# Patient Record
Sex: Female | Born: 1990 | Race: White | Hispanic: No | Marital: Single | Smoking: Current every day smoker
Health system: Southern US, Community
[De-identification: ages and names within clinical notes are randomized; demographics above are authoritative.]

## PROBLEM LIST (undated history)

## (undated) DIAGNOSIS — E282 Polycystic ovarian syndrome: Secondary | ICD-10-CM

## (undated) DIAGNOSIS — H579 Unspecified disorder of eye and adnexa: Secondary | ICD-10-CM

## (undated) DIAGNOSIS — I10 Essential (primary) hypertension: Secondary | ICD-10-CM

---

## 2014-03-08 ENCOUNTER — Encounter (HOSPITAL_COMMUNITY): Payer: Self-pay | Admitting: Emergency Medicine

## 2014-03-08 ENCOUNTER — Emergency Department (HOSPITAL_COMMUNITY)
Admission: EM | Admit: 2014-03-08 | Discharge: 2014-03-08 | Disposition: A | Discharge status: Home / Self Care | Payer: No Typology Code available for payment source | Attending: Emergency Medicine | Admitting: Emergency Medicine

## 2014-03-08 DIAGNOSIS — I10 Essential (primary) hypertension: Secondary | ICD-10-CM | POA: Insufficient documentation

## 2014-03-08 DIAGNOSIS — Z8742 Personal history of other diseases of the female genital tract: Secondary | ICD-10-CM | POA: Insufficient documentation

## 2014-03-08 DIAGNOSIS — R1110 Vomiting, unspecified: Secondary | ICD-10-CM

## 2014-03-08 DIAGNOSIS — R112 Nausea with vomiting, unspecified: Secondary | ICD-10-CM | POA: Insufficient documentation

## 2014-03-08 DIAGNOSIS — R42 Dizziness and giddiness: Secondary | ICD-10-CM | POA: Insufficient documentation

## 2014-03-08 DIAGNOSIS — R1013 Epigastric pain: Secondary | ICD-10-CM | POA: Insufficient documentation

## 2014-03-08 DIAGNOSIS — R197 Diarrhea, unspecified: Secondary | ICD-10-CM | POA: Insufficient documentation

## 2014-03-08 DIAGNOSIS — Z91040 Latex allergy status: Secondary | ICD-10-CM | POA: Insufficient documentation

## 2014-03-08 DIAGNOSIS — Z3202 Encounter for pregnancy test, result negative: Secondary | ICD-10-CM | POA: Insufficient documentation

## 2014-03-08 HISTORY — DX: Polycystic ovarian syndrome: E28.2

## 2014-03-08 HISTORY — DX: Essential (primary) hypertension: I10

## 2014-03-08 LAB — COMPREHENSIVE METABOLIC PANEL
ALT: 16 U/L (ref 0–35)
ANION GAP: 12 (ref 5–15)
AST: 16 U/L (ref 0–37)
Albumin: 4.3 g/dL (ref 3.5–5.2)
Alkaline Phosphatase: 59 U/L (ref 39–117)
BUN: 8 mg/dL (ref 6–23)
CALCIUM: 9.4 mg/dL (ref 8.4–10.5)
CO2: 26 meq/L (ref 19–32)
CREATININE: 0.77 mg/dL (ref 0.50–1.10)
Chloride: 102 mEq/L (ref 96–112)
Glucose, Bld: 84 mg/dL (ref 70–99)
Potassium: 4.4 mEq/L (ref 3.7–5.3)
Sodium: 140 mEq/L (ref 137–147)
Total Bilirubin: 0.4 mg/dL (ref 0.3–1.2)
Total Protein: 7.7 g/dL (ref 6.0–8.3)

## 2014-03-08 LAB — CBC WITH DIFFERENTIAL/PLATELET
Basophils Absolute: 0 10*3/uL (ref 0.0–0.1)
Basophils Relative: 0 % (ref 0–1)
EOS PCT: 2 % (ref 0–5)
Eosinophils Absolute: 0.2 10*3/uL (ref 0.0–0.7)
HEMATOCRIT: 47.2 % — AB (ref 36.0–46.0)
Hemoglobin: 16.3 g/dL — ABNORMAL HIGH (ref 12.0–15.0)
LYMPHS ABS: 4.3 10*3/uL — AB (ref 0.7–4.0)
LYMPHS PCT: 38 % (ref 12–46)
MCH: 31.5 pg (ref 26.0–34.0)
MCHC: 34.5 g/dL (ref 30.0–36.0)
MCV: 91.3 fL (ref 78.0–100.0)
MONO ABS: 0.9 10*3/uL (ref 0.1–1.0)
Monocytes Relative: 8 % (ref 3–12)
Neutro Abs: 6.1 10*3/uL (ref 1.7–7.7)
Neutrophils Relative %: 52 % (ref 43–77)
Platelets: 288 10*3/uL (ref 150–400)
RBC: 5.17 MIL/uL — AB (ref 3.87–5.11)
RDW: 12.8 % (ref 11.5–15.5)
WBC: 11.5 10*3/uL — ABNORMAL HIGH (ref 4.0–10.5)

## 2014-03-08 LAB — URINALYSIS, ROUTINE W REFLEX MICROSCOPIC
Glucose, UA: NEGATIVE mg/dL
Hgb urine dipstick: NEGATIVE
KETONES UR: 15 mg/dL — AB
LEUKOCYTES UA: NEGATIVE
NITRITE: NEGATIVE
Protein, ur: NEGATIVE mg/dL
Specific Gravity, Urine: 1.028 (ref 1.005–1.030)
Urobilinogen, UA: 1 mg/dL (ref 0.0–1.0)
pH: 5.5 (ref 5.0–8.0)

## 2014-03-08 LAB — LIPASE, BLOOD: LIPASE: 20 U/L (ref 11–59)

## 2014-03-08 LAB — PREGNANCY, URINE: Preg Test, Ur: NEGATIVE

## 2014-03-08 MED ORDER — MORPHINE SULFATE 4 MG/ML IJ SOLN
4.0000 mg | Freq: Once | INTRAMUSCULAR | Status: DC
  Filled 2014-03-08: qty 1

## 2014-03-08 MED ORDER — ONDANSETRON HCL 4 MG PO TABS: 4.0000 mg | ORAL_TABLET | Freq: Four times a day (QID) | ORAL | Status: DC

## 2014-03-08 MED ORDER — SODIUM CHLORIDE 0.9 % IV SOLN
1000.0000 mL | Freq: Once | INTRAVENOUS | Status: AC
  Administered 2014-03-08: 1000 mL via INTRAVENOUS

## 2014-03-08 MED ORDER — ONDANSETRON HCL 4 MG/2ML IJ SOLN
4.0000 mg | Freq: Once | INTRAMUSCULAR | Status: AC
  Administered 2014-03-08: 4 mg via INTRAVENOUS
  Filled 2014-03-08: qty 2

## 2014-03-08 MED ORDER — SODIUM CHLORIDE 0.9 % IV SOLN: 1000.0000 mL | INTRAVENOUS | Status: DC

## 2014-03-08 NOTE — ED Notes (Signed)
Pt c/o N/V/D x 1 today with some lower back pain sharp in nature

## 2014-03-08 NOTE — ED Notes (Signed)
Patient states she is unable to provide urine specimen at this time.

## 2014-03-08 NOTE — ED Notes (Signed)
Pt stating she is feeling better after liter of fluid.  EDP made aware.  Nausea relieved.

## 2014-03-08 NOTE — ED Notes (Signed)
Pt stating that she does not morphine at the moment. "I have never had morphine.  I don't know how it's gonna make me feel".

## 2014-03-08 NOTE — ED Provider Notes (Signed)
CSN: [id number]     Arrival date & time 03/08/14  1300 History   First MD Initiated Contact with Patient 03/08/14 [phone]     Chief Complaint  Patient presents with  . Emesis  . Back Pain  . Diarrhea   HPI Pt woke up this am with nausea and vomiting.  She then started having trouble with a large amount of diarrhea.  Two episodes since of vomiting and diarrhea.  She has had some sharp pains in her back when she has to use the bathroom.  Some stomach cramps.  She feels lightheaded and dizzy.  No appetite.  Can keep down fluids.  No Menses for two years due to PCOS.  Past Medical History  Diagnosis Date  . PCOS (polycystic ovarian syndrome)   . Hypertension    History reviewed. No pertinent past surgical history. History reviewed. No pertinent family history. History  Substance Use Topics  . Smoking status: Never Smoker   . Smokeless tobacco: Not on file  . Alcohol Use: Yes     Comment: occ   OB History   Grav Para Term Preterm Abortions TAB SAB Ect Mult Living                 Review of Systems  Constitutional: Negative for fever.  Genitourinary: Negative for dysuria, vaginal bleeding and vaginal discharge.  All other systems reviewed and are negative.     Allergies  Asa; Ibuprofen; and Latex  Home Medications   Prior to Admission medications   Medication Sig Start Date End Date Taking? Authorizing Provider  ondansetron (ZOFRAN) 4 MG tablet Take 1 tablet (4 mg total) by mouth every 6 (six) hours. 03/08/14   Linwood DibblesJon Chanse Kagel, MD   BP 151/78  Pulse 73  Temp(Src) 98.3 F (36.8 C) (Oral)  Resp 23  SpO2 100% Physical Exam  Nursing note and vitals reviewed. Constitutional: She appears well-developed and well-nourished. No distress.  Obese   HENT:  Head: Normocephalic and atraumatic.  Right Ear: External ear normal.  Left Ear: External ear normal.  Eyes: Conjunctivae are normal. Right eye exhibits no discharge. Left eye exhibits no discharge. No scleral icterus.  Neck: Neck  supple. No tracheal deviation present.  Cardiovascular: Normal rate, regular rhythm and intact distal pulses.   Pulmonary/Chest: Effort normal and breath sounds normal. No stridor. No respiratory distress. She has no wheezes. She has no rales.  Abdominal: Soft. Bowel sounds are normal. She exhibits no distension. There is tenderness in the epigastric area. There is no rebound and no guarding. No hernia.  Mild ttp  Musculoskeletal: She exhibits no edema and no tenderness.  Neurological: She is alert. She has normal strength. No cranial nerve deficit (no facial droop, extraocular movements intact, no slurred speech) or sensory deficit. She exhibits normal muscle tone. She displays no seizure activity. Coordination normal.  Skin: Skin is warm and dry. No rash noted.  Psychiatric: She has a normal mood and affect.    ED Course  Procedures (including critical care time) Labs Review Labs Reviewed  CBC WITH DIFFERENTIAL - Abnormal; Notable for the following:    WBC 11.5 (*)    RBC 5.17 (*)    Hemoglobin 16.3 (*)    HCT 47.2 (*)    Lymphs Abs 4.3 (*)    All other components within normal limits  URINALYSIS, ROUTINE W REFLEX MICROSCOPIC - Abnormal; Notable for the following:    Color, Urine AMBER (*)    APPearance CLOUDY (*)  Bilirubin Urine SMALL (*)    Ketones, ur 15 (*)    All other components within normal limits  COMPREHENSIVE METABOLIC PANEL  LIPASE, BLOOD  PREGNANCY, URINE  POC URINE PREG, ED      MDM   Final diagnoses:  Vomiting and diarrhea    Symptoms improved after treatment in the ED.  She states she feels much better and is hungry and wants to get something to eat.  She was given fluids and antiemetics.  Suspect viral etiology.  Doubt appendicitis.    At this time there does not appear to be any evidence of an acute emergency medical condition and the patient appears stable for discharge with appropriate outpatient follow up.     Linwood Dibbles, MD 03/08/14 [phone]

## 2014-03-08 NOTE — Discharge Instructions (Signed)

## 2014-06-28 ENCOUNTER — Emergency Department (HOSPITAL_COMMUNITY): Payer: No Typology Code available for payment source

## 2014-06-28 ENCOUNTER — Encounter (HOSPITAL_COMMUNITY): Payer: Self-pay | Admitting: Family Medicine

## 2014-06-28 ENCOUNTER — Emergency Department (HOSPITAL_COMMUNITY)
Admission: EM | Admit: 2014-06-28 | Discharge: 2014-06-28 | Disposition: A | Discharge status: Home / Self Care | Payer: No Typology Code available for payment source | Attending: Emergency Medicine | Admitting: Emergency Medicine

## 2014-06-28 DIAGNOSIS — N939 Abnormal uterine and vaginal bleeding, unspecified: Secondary | ICD-10-CM | POA: Diagnosis not present

## 2014-06-28 DIAGNOSIS — Z3202 Encounter for pregnancy test, result negative: Secondary | ICD-10-CM | POA: Insufficient documentation

## 2014-06-28 DIAGNOSIS — I10 Essential (primary) hypertension: Secondary | ICD-10-CM | POA: Diagnosis not present

## 2014-06-28 DIAGNOSIS — R509 Fever, unspecified: Secondary | ICD-10-CM | POA: Diagnosis not present

## 2014-06-28 DIAGNOSIS — Z91040 Latex allergy status: Secondary | ICD-10-CM | POA: Diagnosis not present

## 2014-06-28 DIAGNOSIS — K529 Noninfective gastroenteritis and colitis, unspecified: Secondary | ICD-10-CM | POA: Diagnosis not present

## 2014-06-28 DIAGNOSIS — R1031 Right lower quadrant pain: Secondary | ICD-10-CM | POA: Diagnosis present

## 2014-06-28 DIAGNOSIS — E282 Polycystic ovarian syndrome: Secondary | ICD-10-CM | POA: Diagnosis not present

## 2014-06-28 LAB — URINE MICROSCOPIC-ADD ON

## 2014-06-28 LAB — CBC WITH DIFFERENTIAL/PLATELET
BASOS ABS: 0 10*3/uL (ref 0.0–0.1)
Basophils Relative: 0 % (ref 0–1)
EOS PCT: 1 % (ref 0–5)
Eosinophils Absolute: 0.2 10*3/uL (ref 0.0–0.7)
HEMATOCRIT: 44.6 % (ref 36.0–46.0)
Hemoglobin: 14.9 g/dL (ref 12.0–15.0)
LYMPHS ABS: 3.6 10*3/uL (ref 0.7–4.0)
LYMPHS PCT: 21 % (ref 12–46)
MCH: 30.2 pg (ref 26.0–34.0)
MCHC: 33.4 g/dL (ref 30.0–36.0)
MCV: 90.5 fL (ref 78.0–100.0)
MONO ABS: 1.2 10*3/uL — AB (ref 0.1–1.0)
MONOS PCT: 7 % (ref 3–12)
Neutro Abs: 12.3 10*3/uL — ABNORMAL HIGH (ref 1.7–7.7)
Neutrophils Relative %: 71 % (ref 43–77)
Platelets: 314 10*3/uL (ref 150–400)
RBC: 4.93 MIL/uL (ref 3.87–5.11)
RDW: 12.5 % (ref 11.5–15.5)
WBC: 17.3 10*3/uL — ABNORMAL HIGH (ref 4.0–10.5)

## 2014-06-28 LAB — URINALYSIS, ROUTINE W REFLEX MICROSCOPIC
Bilirubin Urine: NEGATIVE
GLUCOSE, UA: NEGATIVE mg/dL
KETONES UR: NEGATIVE mg/dL
Nitrite: NEGATIVE
PROTEIN: NEGATIVE mg/dL
Specific Gravity, Urine: 1.024 (ref 1.005–1.030)
Urobilinogen, UA: 1 mg/dL (ref 0.0–1.0)
pH: 5.5 (ref 5.0–8.0)

## 2014-06-28 LAB — WET PREP, GENITAL
Clue Cells Wet Prep HPF POC: NONE SEEN
Trich, Wet Prep: NONE SEEN
YEAST WET PREP: NONE SEEN

## 2014-06-28 LAB — COMPREHENSIVE METABOLIC PANEL
ALT: 9 U/L (ref 0–35)
AST: 10 U/L (ref 0–37)
Albumin: 3.8 g/dL (ref 3.5–5.2)
Alkaline Phosphatase: 74 U/L (ref 39–117)
Anion gap: 13 (ref 5–15)
BUN: 7 mg/dL (ref 6–23)
CO2: 28 meq/L (ref 19–32)
CREATININE: 0.72 mg/dL (ref 0.50–1.10)
Calcium: 9.4 mg/dL (ref 8.4–10.5)
Chloride: 99 mEq/L (ref 96–112)
GLUCOSE: 90 mg/dL (ref 70–99)
Potassium: 3.5 mEq/L — ABNORMAL LOW (ref 3.7–5.3)
Sodium: 140 mEq/L (ref 137–147)
Total Bilirubin: 0.4 mg/dL (ref 0.3–1.2)
Total Protein: 7.4 g/dL (ref 6.0–8.3)

## 2014-06-28 LAB — PREGNANCY, URINE: Preg Test, Ur: NEGATIVE

## 2014-06-28 MED ORDER — METRONIDAZOLE 500 MG PO TABS
500.0000 mg | ORAL_TABLET | Freq: Two times a day (BID) | ORAL | Status: AC
Start: 2014-06-28 — End: ?

## 2014-06-28 MED ORDER — HYDROCODONE-ACETAMINOPHEN 5-325 MG PO TABS
2.0000 | ORAL_TABLET | Freq: Once | ORAL | Status: AC
  Administered 2014-06-28: 2 via ORAL
  Filled 2014-06-28: qty 2

## 2014-06-28 MED ORDER — CIPROFLOXACIN HCL 500 MG PO TABS: 500.0000 mg | ORAL_TABLET | Freq: Two times a day (BID) | ORAL | Status: AC

## 2014-06-28 MED ORDER — IOHEXOL 300 MG/ML  SOLN
100.0000 mL | Freq: Once | INTRAMUSCULAR | Status: AC | PRN
  Administered 2014-06-28: 100 mL via INTRAVENOUS

## 2014-06-28 MED ORDER — HYDROCODONE-ACETAMINOPHEN 5-325 MG PO TABS: 1.0000 | ORAL_TABLET | Freq: Four times a day (QID) | ORAL | Status: AC | PRN

## 2014-06-28 MED ORDER — IOHEXOL 300 MG/ML  SOLN
25.0000 mL | Freq: Once | INTRAMUSCULAR | Status: AC | PRN
  Administered 2014-06-28: 25 mL via ORAL

## 2014-06-28 NOTE — ED Provider Notes (Signed)
CSN: [id number]     Arrival date & time 06/28/14  1525 History   First MD Initiated Contact with Patient 06/28/14 1746     Chief Complaint  Patient presents with  . Abdominal Pain     (Consider location/radiation/quality/duration/timing/severity/associated sxs/prior Treatment) Patient is a 23 y.o. female presenting with abdominal pain. The history is provided by the patient and medical records. No language interpreter was used.  Abdominal Pain Associated symptoms: vaginal bleeding and vaginal discharge   Associated symptoms: no chest pain, no constipation, no cough, no diarrhea, no dysuria, no fatigue, no fever, no hematuria, no nausea, no shortness of breath and no vomiting      Joan Livingston is a 23 y.o. female  with a hx of PCOS presents to the Emergency Department complaining of gradual, persistent, progressively worsening RLQ abd pain onset 2 days ago.  Pt reports the pain is sharp, burning, throbbing rated at a 10/10.  Pt reports the pain is similar to previous ovarian cyst pain but is much more painful.  Pt reports taking tylenol for her pain without relief.  Pt reports she began her 4 days ago and she had usual pain and cramping.  She reports she has not had a menstrual cycle in 2 years until 2 days.  Pt reports she is sexually active without use of birth control, but reports using a condom every time.  Pt without hx of STD.  Associated symptoms include white mucous vaginal discharge mixed with the blood.  Nothing makes it better and nothing makes it worse.  Pt denies fever, chills, headache, neck pain, chest pain, SOB, N/V/D, weakness, dizziness, syncope, dysuria. Patient denies history of abdominal surgeries.   Past Medical History  Diagnosis Date  . PCOS (polycystic ovarian syndrome)   . Hypertension    History reviewed. No pertinent past surgical history. History reviewed. No pertinent family history. History  Substance Use Topics  . Smoking status: Never Smoker   .  Smokeless tobacco: Not on file  . Alcohol Use: Yes     Comment: occ   OB History    No data available     Review of Systems  Constitutional: Negative for fever, diaphoresis, appetite change, fatigue and unexpected weight change.  HENT: Negative for mouth sores.   Eyes: Negative for visual disturbance.  Respiratory: Negative for cough, chest tightness, shortness of breath and wheezing.   Cardiovascular: Negative for chest pain.  Gastrointestinal: Positive for abdominal pain. Negative for nausea, vomiting, diarrhea and constipation.  Endocrine: Negative for polydipsia, polyphagia and polyuria.  Genitourinary: Positive for vaginal bleeding and vaginal discharge. Negative for dysuria, urgency, frequency and hematuria.  Musculoskeletal: Negative for back pain and neck stiffness.  Skin: Negative for rash.  Allergic/Immunologic: Negative for immunocompromised state.  Neurological: Negative for syncope, light-headedness and headaches.  Hematological: Does not bruise/bleed easily.  Psychiatric/Behavioral: Negative for sleep disturbance. The patient is not nervous/anxious.       Allergies  Asa; Ibuprofen; and Latex  Home Medications   Prior to Admission medications   Medication Sig Start Date End Date Taking? Authorizing Provider  ciprofloxacin (CIPRO) 500 MG tablet Take 1 tablet (500 mg total) by mouth every 12 (twelve) hours. 06/28/14   Shruthi Northrup, PA-C  HYDROcodone-acetaminophen (NORCO/VICODIN) 5-325 MG per tablet Take 1-2 tablets by mouth every 6 (six) hours as needed for moderate pain or severe pain. 06/28/14   Emry Tobin, PA-C  metroNIDAZOLE (FLAGYL) 500 MG tablet Take 1 tablet (500 mg total) by mouth 2 (two) times  daily. One po bid x 7 days 06/28/14   Dahlia Client Monae Topping, PA-C   BP 124/67 mmHg  Pulse 67  Temp(Src) 97.8 F (36.6 C) (Oral)  Resp 20  Ht 5\' 3"  (1.6 m)  Wt 220 lb (99.791 kg)  BMI 38.98 kg/m2  SpO2 99%  LMP 06/24/2014 Physical Exam   Constitutional: She appears well-developed and well-nourished. No distress.  HENT:  Head: Normocephalic and atraumatic.  Mouth/Throat: Oropharynx is clear and moist.  Eyes: Conjunctivae are normal. No scleral icterus.  Neck: Normal range of motion.  Cardiovascular: Normal rate, regular rhythm, normal heart sounds and intact distal pulses.   No murmur heard. Pulmonary/Chest: Effort normal and breath sounds normal. No respiratory distress. She has no wheezes.  Abdominal: Soft. Bowel sounds are normal. She exhibits no distension and no mass. There is tenderness in the right lower quadrant, suprapubic area and left lower quadrant. There is guarding. There is no rebound and no CVA tenderness. Hernia confirmed negative in the right inguinal area and confirmed negative in the left inguinal area.  No CVA tenderness Lower abd tenderness, worst in the RLQ without rebound  Genitourinary: Uterus normal. No labial fusion. There is no rash, tenderness or lesion on the right labia. There is no rash, tenderness or lesion on the left labia. Uterus is not deviated, not enlarged, not fixed and not tender. Cervix exhibits no motion tenderness, no discharge and no friability. Right adnexum displays tenderness (very mild). Right adnexum displays no mass and no fullness. Left adnexum displays no mass, no tenderness and no fullness. No erythema, tenderness or bleeding in the vagina. No foreign body around the vagina. No signs of injury around the vagina. Vaginal discharge ( thick, brown) found.  Musculoskeletal: Normal range of motion. She exhibits no edema.  Lymphadenopathy:       Right: No inguinal adenopathy present.       Left: No inguinal adenopathy present.  Neurological: She is alert.  Skin: Skin is warm and dry. She is not diaphoretic. No erythema.  Psychiatric: She has a normal mood and affect.  Nursing note and vitals reviewed.   ED Course  Procedures (including critical care time) Labs Review Labs  Reviewed  WET PREP, GENITAL - Abnormal; Notable for the following:    WBC, Wet Prep HPF POC MANY (*)    All other components within normal limits  URINALYSIS, ROUTINE W REFLEX MICROSCOPIC - Abnormal; Notable for the following:    APPearance TURBID (*)    Hgb urine dipstick LARGE (*)    Leukocytes, UA SMALL (*)    All other components within normal limits  CBC WITH DIFFERENTIAL - Abnormal; Notable for the following:    WBC 17.3 (*)    Neutro Abs 12.3 (*)    Monocytes Absolute 1.2 (*)    All other components within normal limits  COMPREHENSIVE METABOLIC PANEL - Abnormal; Notable for the following:    Potassium 3.5 (*)    All other components within normal limits  URINE MICROSCOPIC-ADD ON - Abnormal; Notable for the following:    Bacteria, UA FEW (*)    All other components within normal limits  GC/CHLAMYDIA PROBE AMP  PREGNANCY, URINE  RPR  HIV ANTIBODY (ROUTINE TESTING)    Imaging Review US Transvaginal Non-ob  06/28/2014   CLINICAL DATA:  Right lower quadrant abdominal pain for 2 days. History of polycystic ovarian disease. Evaluate for torsion. Initial encounter.  EXAM: TRANSABDOMINAL AND TRANSVAGINAL ULTRASOUND OF PELVIS  DOPPLER ULTRASOUND OF OVARIES  TECHNIQUE: Both  transabdominal and transvaginal ultrasound examinations of the pelvis were performed. Transabdominal technique was performed for global imaging of the pelvis including uterus, ovaries, adnexal regions, and pelvic cul-de-sac.  It was necessary to proceed with endovaginal exam following the transabdominal exam to visualize the endometrium. Color and duplex Doppler ultrasound was utilized to evaluate blood flow to the ovaries.  COMPARISON:  Abdominal pelvic CT same date.  FINDINGS: Uterus  Measurements: 6.8 x 2.6 x 3.4 cm. No fibroids or other mass visualized.  Endometrium  Thickness: 5.8 mm.  No focal abnormality visualized.  Right ovary  Measurements: 3.0 x 2.1 x 1.9 cm. Normal appearance/no adnexal mass. There is blood  flow with color Doppler.  Left ovary  Measurements: 4.5 x 4.4 x 4.3 cm. As noted on CT, the left ovary is enlarged with multiple mildly complex cystic areas, the largest measuring 2.5 and 3.0 cm. No solid mass identified. There is blood flow with color Doppler.  Pulsed Doppler evaluation of both ovaries demonstrates normal low-resistance arterial and venous waveforms.  Other findings  Trace free pelvic fluid is present  IMPRESSION: 1. Mildly complex left ovarian cysts associated with mild enlargement of the left ovary. 2. The right ovary appears normal. 3. No evidence of torsion.   Electronically Signed   By: Roxy Horseman M.D.   On: 06/28/2014 20:51   US Pelvis Complete  06/28/2014   CLINICAL DATA:  Right lower quadrant abdominal pain for 2 days. History of polycystic ovarian disease. Evaluate for torsion. Initial encounter.  EXAM: TRANSABDOMINAL AND TRANSVAGINAL ULTRASOUND OF PELVIS  DOPPLER ULTRASOUND OF OVARIES  TECHNIQUE: Both transabdominal and transvaginal ultrasound examinations of the pelvis were performed. Transabdominal technique was performed for global imaging of the pelvis including uterus, ovaries, adnexal regions, and pelvic cul-de-sac.  It was necessary to proceed with endovaginal exam following the transabdominal exam to visualize the endometrium. Color and duplex Doppler ultrasound was utilized to evaluate blood flow to the ovaries.  COMPARISON:  Abdominal pelvic CT same date.  FINDINGS: Uterus  Measurements: 6.8 x 2.6 x 3.4 cm. No fibroids or other mass visualized.  Endometrium  Thickness: 5.8 mm.  No focal abnormality visualized.  Right ovary  Measurements: 3.0 x 2.1 x 1.9 cm. Normal appearance/no adnexal mass. There is blood flow with color Doppler.  Left ovary  Measurements: 4.5 x 4.4 x 4.3 cm. As noted on CT, the left ovary is enlarged with multiple mildly complex cystic areas, the largest measuring 2.5 and 3.0 cm. No solid mass identified. There is blood flow with color Doppler.  Pulsed  Doppler evaluation of both ovaries demonstrates normal low-resistance arterial and venous waveforms.  Other findings  Trace free pelvic fluid is present  IMPRESSION: 1. Mildly complex left ovarian cysts associated with mild enlargement of the left ovary. 2. The right ovary appears normal. 3. No evidence of torsion.   Electronically Signed   By: Roxy Horseman M.D.   On: 06/28/2014 20:51   Ct Abdomen Pelvis W Contrast  06/28/2014   CLINICAL DATA:  Gradual persistent progressively worsening RIGHT lower quadrant abdominal pain beginning 2 days ago, pain now sharp burning and throbbing rated 10/10 ; vaginal bleeding and discharge; history hypertension  EXAM: CT ABDOMEN AND PELVIS WITH CONTRAST  TECHNIQUE: Multidetector CT imaging of the abdomen and pelvis was performed using the standard protocol following bolus administration of intravenous contrast. Sagittal and coronal MPR images reconstructed from axial data set.  CONTRAST:  OMNIPAQUE IOHEXOL 300 MG/ML SOLN IV. Dilute oral contrast.  COMPARISON:  None  FINDINGS: Lung bases clear.  Liver, spleen, pancreas, kidneys, and adrenal glands normal.  Splenule at splenic hilum.  Normal appendix.  Bladder, ureters, uterus and RIGHT ovary.  LEFT ovary enlarged measuring 6.3 x 5.9 x 4.4 cm in size, containing multiple cystic areas, question complex cystic mass, multiple cysts, less likely tubo-ovarian abscess.  Subtle bowel wall thickening and hyperemia of terminal ileum extending to ileocecal valve.  Scattered distal colonic diverticulosis.  Stomach and remaining bowel loops normal appearance.  No adenopathy, free air, free fluid, or acute osseous findings.  IMPRESSION: Normal appendix.  Subtle bowel wall thickening and hyperemia of terminal ileum extended ileocecal valve suggestive of small bowel enteritis, question infection versus inflammatory bowel disease.  Multi-cystic mass of an enlarged LEFT ovary 6.3 x 5.9 x 4.4 cm in size question cystic neoplasm, multiple  cysts, or tubo-ovarian abscess; sonographic evaluation recommended.   Electronically Signed   By: Ulyses Southward M.D.   On: 06/28/2014 20:23   [place] Art/ven Flow Abd Pelv Doppler  06/28/2014   CLINICAL DATA:  Right lower quadrant abdominal pain for 2 days. History of polycystic ovarian disease. Evaluate for torsion. Initial encounter.  EXAM: TRANSABDOMINAL AND TRANSVAGINAL ULTRASOUND OF PELVIS  DOPPLER ULTRASOUND OF OVARIES  TECHNIQUE: Both transabdominal and transvaginal ultrasound examinations of the pelvis were performed. Transabdominal technique was performed for global imaging of the pelvis including uterus, ovaries, adnexal regions, and pelvic cul-de-sac.  It was necessary to proceed with endovaginal exam following the transabdominal exam to visualize the endometrium. Color and duplex Doppler ultrasound was utilized to evaluate blood flow to the ovaries.  COMPARISON:  Abdominal pelvic CT same date.  FINDINGS: Uterus  Measurements: 6.8 x 2.6 x 3.4 cm. No fibroids or other mass visualized.  Endometrium  Thickness: 5.8 mm.  No focal abnormality visualized.  Right ovary  Measurements: 3.0 x 2.1 x 1.9 cm. Normal appearance/no adnexal mass. There is blood flow with color Doppler.  Left ovary  Measurements: 4.5 x 4.4 x 4.3 cm. As noted on CT, the left ovary is enlarged with multiple mildly complex cystic areas, the largest measuring 2.5 and 3.0 cm. No solid mass identified. There is blood flow with color Doppler.  Pulsed Doppler evaluation of both ovaries demonstrates normal low-resistance arterial and venous waveforms.  Other findings  Trace free pelvic fluid is present  IMPRESSION: 1. Mildly complex left ovarian cysts associated with mild enlargement of the left ovary. 2. The right ovary appears normal. 3. No evidence of torsion.   Electronically Signed   By: Roxy Horseman M.D.   On: 06/28/2014 20:51     EKG Interpretation None      MDM   Final diagnoses:  RLQ abdominal pain  PCOS (polycystic ovarian  syndrome)  Vaginal bleeding  Enteritis   Joan Livingston presents with RLQ abd pain, and mild vaginal discharge several days after the onset of her menses. Patient with a history of PCO S and believes her pain is related to a cyst. Patient denies fevers or chills, nausea or vomiting. She did have diarrhea the onset of her menses but this is common for her.  Pelvic exam with very mild right adnexal tenderness, no cervical motion tenderness and no left adnexal tenderness or masses. Patient with right lower quadrant abdominal pain with guarding on exam and very mild left lower quadrant abdominal pain on exam. Will obtain CT scan and ultrasound.  Wet prep with many white blood cells however no clinical evidence of PID as  patient does not have cervical motion tenderness. CT scan with subtle bowel wall thickening and hyperemia of the terminal ileum suggestive small bowel enteritis.  Unclear on the CT whether this is infection versus inflammatory bowel disease. Left ovary with an large cystic mass characterized on the ultrasound as complex left ovarian cysts with normal Doppler.  Pelvic ultrasound without evidence of left mass being a tubo-ovarian abscess.  Patient's pain controlled here in the emergency department and she is tolerating by mouth without difficulty. No emesis. Patient is alert, oriented, nontoxic and nonseptic appearing. She is febrile and non-tachycardic. Repeat abdominal exam shows persistent mild right lower quadrant abdominal tenderness but abdomen is soft without rigidity or guarding. No peritoneal signs.  I have personally reviewed patient's vitals, nursing note and any pertinent labs or imaging.  I performed an focused physical exam; undressed when appropriate .    It has been determined that no acute conditions requiring further emergency intervention are present at this time. The patient/guardian have been advised of the diagnosis and plan. Gonorrhea and Chlamydia cultures are  pending and patient will need treatment if they return as positive.  I reviewed any labs and imaging including any potential incidental findings. We have discussed signs and symptoms that warrant return to the ED including fever, worsening pain or vomiting.    Vital signs are stable at discharge.   BP 124/67 mmHg  Pulse 67  Temp(Src) 97.8 F (36.6 C) (Oral)  Resp 20  Ht 5\' 3"  (1.6 m)  Wt 220 lb (99.791 kg)  BMI 38.98 kg/m2  SpO2 99%  LMP 06/24/2014        Dierdre ForthHannah Aliah Eriksson, PA-C 06/28/14 2217  Benny LennertJoseph L Zammit, MD 06/28/14 2226

## 2014-06-28 NOTE — ED Notes (Signed)
Pelvic cart ready @ bedside, pt fully undressed waist down.

## 2014-06-28 NOTE — ED Notes (Signed)
Pt at CT

## 2014-06-28 NOTE — Discharge Instructions (Signed)
1. Medications: cipro, flagyl, vicodin, usual home medications 2. Treatment: rest, drink plenty of fluids,  3. Follow Up: Please followup with your primary doctor and/or gastroenterologist in 3 days for discussion of your diagnoses and further evaluation after today's visit; if you do not have a primary care doctor use the resource guide provided to find one; Please return to the ER for fevers, worsening abdominal pain, vomiting or other concerning symptoms   Abdominal Pain Many things can cause abdominal pain. Usually, abdominal pain is not caused by a disease and will improve without treatment. It can often be observed and treated at home. Your health care provider will do a physical exam and possibly order blood tests and X-rays to help determine the seriousness of your pain. However, in many cases, more time must pass before a clear cause of the pain can be found. Before that point, your health care provider may not know if you need more testing or further treatment. HOME CARE INSTRUCTIONS  Monitor your abdominal pain for any changes. The following actions may help to alleviate any discomfort you are experiencing:  Only take over-the-counter or prescription medicines as directed by your health care provider.  Do not take laxatives unless directed to do so by your health care provider.  Try a clear liquid diet (broth, tea, or water) as directed by your health care provider. Slowly move to a bland diet as tolerated. SEEK MEDICAL CARE IF:  You have unexplained abdominal pain.  You have abdominal pain associated with nausea or diarrhea.  You have pain when you urinate or have a bowel movement.  You experience abdominal pain that wakes you in the night.  You have abdominal pain that is worsened or improved by eating food.  You have abdominal pain that is worsened with eating fatty foods.  You have a fever. SEEK IMMEDIATE MEDICAL CARE IF:   Your pain does not go away within 2  hours.  You keep throwing up (vomiting).  Your pain is felt only in portions of the abdomen, such as the right side or the left lower portion of the abdomen.  You pass bloody or black tarry stools. MAKE SURE YOU:  Understand these instructions.   Will watch your condition.   Will get help right away if you are not doing well or get worse.  Document Released: 05/05/2005 Document Revised: 07/31/2013 Document Reviewed: 04/04/2013 [facility] Patient Information 2015 [employer] [place], [place]. This information is not intended to replace advice given to you by your health care provider. Make sure you discuss any questions you have with your health care provider.

## 2014-06-28 NOTE — ED Notes (Signed)
Per pt sts lower abdominal pain and vaginal discharge. sts more on the right side. sts hx of polycystic ovarian syndrome.

## 2014-06-29 LAB — HIV ANTIBODY (ROUTINE TESTING W REFLEX): HIV: NONREACTIVE

## 2014-06-29 LAB — GC/CHLAMYDIA PROBE AMP
CT Probe RNA: POSITIVE — AB
GC Probe RNA: POSITIVE — AB

## 2014-06-29 LAB — RPR

## 2014-06-30 ENCOUNTER — Telehealth: Payer: Self-pay | Admitting: Emergency Medicine

## 2014-06-30 NOTE — Telephone Encounter (Signed)
+  Chlamydia. +Gonorrhea. Chart sent to EDP office for review. DHHA attached.

## 2014-07-01 ENCOUNTER — Telehealth (HOSPITAL_BASED_OUTPATIENT_CLINIC_OR_DEPARTMENT_OTHER): Payer: Self-pay | Admitting: *Deleted

## 2014-08-04 ENCOUNTER — Telehealth (HOSPITAL_COMMUNITY): Payer: Self-pay

## 2014-08-04 NOTE — ED Notes (Signed)
Unable to contact pt by mail or telephone. Unable to communicate lab results or treatment changes. 

## 2014-08-12 ENCOUNTER — Emergency Department: Payer: Self-pay | Admitting: Emergency Medicine

## 2014-08-12 LAB — CBC WITH DIFFERENTIAL/PLATELET
Basophil #: 0.1 x10 3/mm 3
Basophil %: 0.7 %
Eosinophil #: 0.3 x10 3/mm 3
Eosinophil %: 1.4 %
HCT: 47.1 % — ABNORMAL HIGH
HGB: 15.5 g/dL
Lymphocyte %: 26.9 %
Lymphs Abs: 5 x10 3/mm 3 — ABNORMAL HIGH
MCH: 30.7 pg
MCHC: 32.9 g/dL
MCV: 93 fL
Monocyte #: 1.1 "x10 3/mm " — ABNORMAL HIGH
Monocyte %: 5.8 %
Neutrophil #: 12 x10 3/mm 3 — ABNORMAL HIGH
Neutrophil %: 65.2 %
Platelet: 346 x10 3/mm 3
RBC: 5.04 X10 6/mm 3
RDW: 13.1 %
WBC: 18.4 x10 3/mm 3 — ABNORMAL HIGH

## 2014-08-12 LAB — URINALYSIS, COMPLETE
Bilirubin,UR: NEGATIVE
GLUCOSE, UR: NEGATIVE mg/dL (ref 0–75)
Ketone: NEGATIVE
Nitrite: NEGATIVE
Ph: 6 (ref 4.5–8.0)
Protein: 100
Specific Gravity: 1.02 (ref 1.003–1.030)
Squamous Epithelial: 7
WBC UR: 102 /HPF (ref 0–5)

## 2014-08-12 LAB — COMPREHENSIVE METABOLIC PANEL
ALBUMIN: 3.9 g/dL (ref 3.4–5.0)
ANION GAP: 8 (ref 7–16)
Alkaline Phosphatase: 79 U/L
BUN: 9 mg/dL (ref 7–18)
Bilirubin,Total: 0.3 mg/dL (ref 0.2–1.0)
CHLORIDE: 103 mmol/L (ref 98–107)
CO2: 29 mmol/L (ref 21–32)
CREATININE: 0.85 mg/dL (ref 0.60–1.30)
Calcium, Total: 9.1 mg/dL (ref 8.5–10.1)
EGFR (African American): >60
EGFR (Non-African Amer.): >60
Glucose: 102 mg/dL — ABNORMAL HIGH (ref 65–99)
Osmolality: 278 (ref 275–301)
Potassium: 3.7 mmol/L (ref 3.5–5.1)
SGOT(AST): 22 U/L (ref 15–37)
SGPT (ALT): 21 U/L
Sodium: 140 mmol/L (ref 136–145)
TOTAL PROTEIN: 7.9 g/dL (ref 6.4–8.2)

## 2014-12-08 NOTE — Consult Note (Signed)
PATIENT NAME:  Joan Livingston, Graclynn J MR#:  [id number] DATE OF BIRTH:  1990-09-15  DATE OF CONSULTATION:  08/13/2014  CONSULTING PHYSICIAN:  Adah Salvageichard E. Excell Seltzerooper, MD  SURGICAL CONSULTATION    CHIEF COMPLAINT: Right lower quadrant pain.   HISTORY OF PRESENT ILLNESS: This is a patient with a 2 day history of abdominal pain, points to the right lower quadrant and suprapubic area stating that it has been going on 2 days. She also states and volunteers that she had the exact same pain 4 to 6 weeks ago, was seen at [facility] and was told that she had an infection in her bowel and was treated with antibiotics. She denies any diarrhea. No nausea or vomiting. No fevers or chills. She had some dysuria earlier today but none since and does not notice any blood in her urine. She has no back pain.   PAST MEDICAL HISTORY: None.   PAST SURGICAL HISTORY: Tonsillectomy, adenoidectomy.   ALLERGIES: ASPIRIN AND IBUPROFEN.   MEDICATIONS: None.   FAMILY HISTORY: Noncontributory.   SOCIAL HISTORY: The patient smokes tobacco.   REVIEW OF SYSTEMS: Ten systems review was performed and negative with the exception of that in the HPI.   PHYSICAL EXAMINATION:  GENERAL: Obese female patient with a BMI of 39. She appears comfortable, in no acute distress.  VITAL SIGNS: Temperature is 96.6, pulse 91, respirations 18, blood pressure 156/77.  HEENT: No scleral icterus.  NECK: No palpable neck nodes.  CHEST: Clear to auscultation.  CARDIAC: Regular rate and rhythm.  ABDOMEN: Soft. There is tenderness in the right lower quadrant without guarding or rebound, also tenderness in the suprapubic area without guarding or rebound. Negative Rovsing sign.  EXTREMITIES: Without edema. The calves are nontender.  NEUROLOGIC: Grossly intact.  INTEGUMENT: No jaundice. There is some component of hirsutism.   LABORATORY VALUES: Demonstrate an elevated white blood cell count of 18,000, an H and H  of 15 and 47, and a platelet count of 346.  Electrolytes are within normal limits. Urinary tract evaluation demonstrates a specific gravity of 1.020, 3+ blood, 1+ leukocyte esterase, 470 RBCs per high-power field with white blood cells approximating 100 per high-power field and trace bacteria.   CT scan is personally reviewed. Appendix appears quite normal. Tip of the appendix slightly enlarged.  More importantly I believe the right salpinx is enlarged and there is a huge left ovarian cyst.   ASSESSMENT AND PLAN: Right lower quadrant pain with leukocytosis, hematuria with considerable microscopic nature, 500 cells per high-powered field, as well as leukocyte esterase positivity. I discussed with Dr. Manson PasseyBrown this patient's possible diagnoses. I doubt that appendicitis explains all that is going on with her and it does not really show up on CT scan either. She has no signs of peritoneal irritation at this time. I asked Dr. Manson PasseyBrown to consider a pelvic ultrasound, which he agreed to do and will call me back if needed.   ____________________________ Adah Salvageichard E. Excell Seltzerooper, MD rec:AT D: 08/13/2014 03:15:51 ET T: 08/13/2014 04:19:45 ET JOB#: [id number]  cc: Adah Salvageichard E. Excell Seltzerooper, MD, <Dictator> Lattie HawICHARD E COOPER MD ELECTRONICALLY SIGNED 08/13/2014 19:43

## 2014-12-08 NOTE — Consult Note (Signed)
Brief Consult Note: Diagnosis: RLQ pain.   Patient was seen by consultant.   Consult note dictated.   Recommend further assessment or treatment.   Discussed with Attending MD.   Comments: nml appendix on CT with leukocytosis, 500 RBC/HPF in urine. Also on CT is large L ov cyst but rt salpinx appears enlarged. discussed with Dr Manson PasseyBrown who will order pelvic U/S.  Electronic Signatures: Lattie Hawooper, Richard E (MD)  (Signed 05-Jan-16 03:07)  Authored: Brief Consult Note   Last Updated: 05-Jan-16 03:07 by Lattie Hawooper, Richard E (MD)

## 2015-02-16 ENCOUNTER — Emergency Department (HOSPITAL_BASED_OUTPATIENT_CLINIC_OR_DEPARTMENT_OTHER)
Admission: EM | Admit: 2015-02-16 | Discharge: 2015-02-16 | Disposition: A | Discharge status: Home / Self Care | Payer: No Typology Code available for payment source | Attending: Emergency Medicine | Admitting: Emergency Medicine

## 2015-02-16 ENCOUNTER — Encounter (HOSPITAL_BASED_OUTPATIENT_CLINIC_OR_DEPARTMENT_OTHER): Payer: Self-pay | Admitting: Emergency Medicine

## 2015-02-16 ENCOUNTER — Emergency Department (HOSPITAL_BASED_OUTPATIENT_CLINIC_OR_DEPARTMENT_OTHER): Payer: No Typology Code available for payment source

## 2015-02-16 DIAGNOSIS — Z8639 Personal history of other endocrine, nutritional and metabolic disease: Secondary | ICD-10-CM | POA: Insufficient documentation

## 2015-02-16 DIAGNOSIS — I10 Essential (primary) hypertension: Secondary | ICD-10-CM | POA: Insufficient documentation

## 2015-02-16 DIAGNOSIS — Z3202 Encounter for pregnancy test, result negative: Secondary | ICD-10-CM | POA: Insufficient documentation

## 2015-02-16 DIAGNOSIS — Z91040 Latex allergy status: Secondary | ICD-10-CM | POA: Insufficient documentation

## 2015-02-16 DIAGNOSIS — R1011 Right upper quadrant pain: Secondary | ICD-10-CM | POA: Insufficient documentation

## 2015-02-16 LAB — URINALYSIS, ROUTINE W REFLEX MICROSCOPIC
Glucose, UA: NEGATIVE mg/dL
Hgb urine dipstick: NEGATIVE
Ketones, ur: 15 mg/dL — AB
NITRITE: NEGATIVE
PH: 5.5 (ref 5.0–8.0)
Protein, ur: NEGATIVE mg/dL
SPECIFIC GRAVITY, URINE: 1.042 — AB (ref 1.005–1.030)
Urobilinogen, UA: 1 mg/dL (ref 0.0–1.0)

## 2015-02-16 LAB — URINE MICROSCOPIC-ADD ON

## 2015-02-16 LAB — CBC WITH DIFFERENTIAL/PLATELET
BASOS ABS: 0 10*3/uL (ref 0.0–0.1)
BASOS PCT: 0 % (ref 0–1)
EOS ABS: 0.1 10*3/uL (ref 0.0–0.7)
Eosinophils Relative: 1 % (ref 0–5)
HCT: 43.1 % (ref 36.0–46.0)
Hemoglobin: 14.8 g/dL (ref 12.0–15.0)
Lymphocytes Relative: 29 % (ref 12–46)
Lymphs Abs: 3.3 10*3/uL (ref 0.7–4.0)
MCH: 30.3 pg (ref 26.0–34.0)
MCHC: 34.3 g/dL (ref 30.0–36.0)
MCV: 88.3 fL (ref 78.0–100.0)
MONOS PCT: 9 % (ref 3–12)
Monocytes Absolute: 1 10*3/uL (ref 0.1–1.0)
NEUTROS PCT: 61 % (ref 43–77)
Neutro Abs: 6.9 10*3/uL (ref 1.7–7.7)
PLATELETS: 249 10*3/uL (ref 150–400)
RBC: 4.88 MIL/uL (ref 3.87–5.11)
RDW: 12.3 % (ref 11.5–15.5)
WBC: 11.4 10*3/uL — AB (ref 4.0–10.5)

## 2015-02-16 LAB — COMPREHENSIVE METABOLIC PANEL
ALBUMIN: 4.3 g/dL (ref 3.5–5.0)
ALT: 19 U/L (ref 14–54)
ANION GAP: 9 (ref 5–15)
AST: 18 U/L (ref 15–41)
Alkaline Phosphatase: 45 U/L (ref 38–126)
BILIRUBIN TOTAL: 1 mg/dL (ref 0.3–1.2)
BUN: 14 mg/dL (ref 6–20)
CALCIUM: 9 mg/dL (ref 8.9–10.3)
CHLORIDE: 104 mmol/L (ref 101–111)
CO2: 26 mmol/L (ref 22–32)
CREATININE: 0.78 mg/dL (ref 0.44–1.00)
GFR calc Af Amer: >60 mL/min (ref >60)
Glucose, Bld: 95 mg/dL (ref 65–99)
Potassium: 3.4 mmol/L — ABNORMAL LOW (ref 3.5–5.1)
Sodium: 139 mmol/L (ref 135–145)
Total Protein: 7.8 g/dL (ref 6.5–8.1)

## 2015-02-16 LAB — LIPASE, BLOOD: Lipase: 19 U/L — ABNORMAL LOW (ref 22–51)

## 2015-02-16 LAB — PREGNANCY, URINE: PREG TEST UR: NEGATIVE

## 2015-02-16 MED ORDER — ACETAMINOPHEN 325 MG PO TABS
650.0000 mg | ORAL_TABLET | Freq: Once | ORAL | Status: AC
  Administered 2015-02-16: 650 mg via ORAL
  Filled 2015-02-16: qty 2

## 2015-02-16 MED ORDER — GI COCKTAIL ~~LOC~~
30.0000 mL | Freq: Once | ORAL | Status: AC
  Administered 2015-02-16: 30 mL via ORAL
  Filled 2015-02-16: qty 30

## 2015-02-16 NOTE — ED Notes (Signed)
Pt asks if it is ok for her to eat. Pt informed she could eat as tolerated. Pt states "good, I'm stopping for food on the way home."

## 2015-02-16 NOTE — Discharge Instructions (Signed)
Abdominal Pain, Women °Abdominal (stomach, pelvic, or belly) pain can be caused by many things. It is important to tell your doctor: °· The location of the pain. °· Does it come and go or is it present all the time? °· Are there things that start the pain (eating certain foods, exercise)? °· Are there other symptoms associated with the pain (fever, nausea, vomiting, diarrhea)? °All of this is helpful to know when trying to find the cause of the pain. °CAUSES  °· Stomach: virus or bacteria infection, or ulcer. °· Intestine: appendicitis (inflamed appendix), regional ileitis (Crohn's disease), ulcerative colitis (inflamed colon), irritable bowel syndrome, diverticulitis (inflamed diverticulum of the colon), or cancer of the stomach or intestine. °· Gallbladder disease or stones in the gallbladder. °· Kidney disease, kidney stones, or infection. °· Pancreas infection or cancer. °· Fibromyalgia (pain disorder). °· Diseases of the female organs: °¨ Uterus: fibroid (non-cancerous) tumors or infection. °¨ Fallopian tubes: infection or tubal pregnancy. °¨ Ovary: cysts or tumors. °¨ Pelvic adhesions (scar tissue). °¨ Endometriosis (uterus lining tissue growing in the pelvis and on the pelvic organs). °¨ Pelvic congestion syndrome (female organs filling up with blood just before the menstrual period). °¨ Pain with the menstrual period. °¨ Pain with ovulation (producing an egg). °¨ Pain with an IUD (intrauterine device, birth control) in the uterus. °¨ Cancer of the female organs. °· Functional pain (pain not caused by a disease, may improve without treatment). °· Psychological pain. °· Depression. °DIAGNOSIS  °Your doctor will decide the seriousness of your pain by doing an examination. °· Blood tests. °· X-rays. °· Ultrasound. °· CT scan (computed tomography, special type of X-Irena Gaydos). °· MRI (magnetic resonance imaging). °· Cultures, for infection. °· Barium enema (dye inserted in the large intestine, to better view it with  X-rays). °· Colonoscopy (looking in intestine with a lighted tube). °· Laparoscopy (minor surgery, looking in abdomen with a lighted tube). °· Major abdominal exploratory surgery (looking in abdomen with a large incision). °TREATMENT  °The treatment will depend on the cause of the pain.  °· Many cases can be observed and treated at home. °· Over-the-counter medicines recommended by your caregiver. °· Prescription medicine. °· Antibiotics, for infection. °· Birth control pills, for painful periods or for ovulation pain. °· Hormone treatment, for endometriosis. °· Nerve blocking injections. °· Physical therapy. °· Antidepressants. °· Counseling with a psychologist or psychiatrist. °· Minor or major surgery. °HOME CARE INSTRUCTIONS  °· Do not take laxatives, unless directed by your caregiver. °· Take over-the-counter pain medicine only if ordered by your caregiver. Do not take aspirin because it can cause an upset stomach or bleeding. °· Try a clear liquid diet (broth or water) as ordered by your caregiver. Slowly move to a bland diet, as tolerated, if the pain is related to the stomach or intestine. °· Have a thermometer and take your temperature several times a day, and record it. °· Bed rest and sleep, if it helps the pain. °· Avoid sexual intercourse, if it causes pain. °· Avoid stressful situations. °· Keep your follow-up appointments and tests, as your caregiver orders. °· If the pain does not go away with medicine or surgery, you may try: °¨ Acupuncture. °¨ Relaxation exercises (yoga, meditation). °¨ Group therapy. °¨ Counseling. °SEEK MEDICAL CARE IF:  °· You notice certain foods cause stomach pain. °· Your home care treatment is not helping your pain. °· You need stronger pain medicine. °· You want your IUD removed. °· You feel faint or   lightheaded. °· You develop nausea and vomiting. °· You develop a rash. °· You are having side effects or an allergy to your medicine. °SEEK IMMEDIATE MEDICAL CARE IF:  °· Your  pain does not go away or gets worse. °· You have a fever. °· Your pain is felt only in portions of the abdomen. The right side could possibly be appendicitis. The left lower portion of the abdomen could be colitis or diverticulitis. °· You are passing blood in your stools (bright red or black tarry stools, with or without vomiting). °· You have blood in your urine. °· You develop chills, with or without a fever. °· You pass out. °MAKE SURE YOU:  °· Understand these instructions. °· Will watch your condition. °· Will get help right away if you are not doing well or get worse. °Document Released: 05/23/2007 Document Revised: 12/10/2013 Document Reviewed: 06/12/2009 °[employer]® Patient Information ©2015 [employer]. This information is not intended to replace advice given to you by your health care provider. Make sure you discuss any questions you have with your health care provider. ° °

## 2015-02-16 NOTE — ED Notes (Addendum)
Pt in c/o R flank pain that radiates to lateral ribs, worse on inspiration. Denies dysuria, frequency, or hx of renal stones.

## 2015-02-16 NOTE — ED Provider Notes (Signed)
CSN: [id number]     Arrival date & time 02/16/15  2125 History  This chart was scribed for Joan Grizzle, Joan Livingston by Abel Presto, ED Scribe. This patient was seen in room MH04/MH04 and the patient's care was started at 10:17 PM.    Chief Complaint  Patient presents with  . Flank Pain     The history is provided by the patient. No language interpreter was used.   HPI Comments: Joan Livingston is a 24 y.o. female with PMHx of HTN and PCOS who presents to the Emergency Department complaining of aching right sided flank pain radiating to lateral ribs with onset yesterday evening. Pt notes movement and dee inspiration aggravates the pain. She reports SOB secondary to pain. She states pain increased today after eating. Pt is an occasional smoker. Pt denies vomiting, nausea, changes in appetite, vaginal bleeding, dysuria and hematuria.   Past Medical History  Diagnosis Date  . PCOS (polycystic ovarian syndrome)   . Hypertension    History reviewed. No pertinent past surgical history. History reviewed. No pertinent family history. History  Substance Use Topics  . Smoking status: Never Smoker   . Smokeless tobacco: Not on file  . Alcohol Use: Yes     Comment: occ   OB History    No data available     Review of Systems  Constitutional: Negative for appetite change.  Gastrointestinal: Positive for abdominal pain. Negative for nausea and vomiting.  Genitourinary: Positive for flank pain. Negative for dysuria, hematuria and vaginal bleeding.  All other systems reviewed and are negative.     Allergies  Asa; Ibuprofen; and Latex  Home Medications   Prior to Admission medications   Medication Sig Start Date End Date Taking? Authorizing Provider  ciprofloxacin (CIPRO) 500 MG tablet Take 1 tablet (500 mg total) by mouth every 12 (twelve) hours. 06/28/14   Hannah Muthersbaugh, PA-C  HYDROcodone-acetaminophen (NORCO/VICODIN) 5-325 MG per tablet Take 1-2 tablets by mouth every 6  (six) hours as needed for moderate pain or severe pain. 06/28/14   Hannah Muthersbaugh, PA-C  metroNIDAZOLE (FLAGYL) 500 MG tablet Take 1 tablet (500 mg total) by mouth 2 (two) times daily. One po bid x 7 days 06/28/14   Dahlia Client Muthersbaugh, PA-C   BP 147/87 mmHg  Pulse 79  Temp(Src) 98.4 F (36.9 C) (Oral)  Resp 20  Ht  (1.6 m)  Wt 225 lb (102.059 kg)  BMI 39.87 kg/m2  SpO2 100%  LMP 07/18/2014 Physical Exam  Constitutional: She is oriented to person, place, and time. She appears well-developed and well-nourished.  HENT:  Head: Normocephalic.  Eyes: Conjunctivae are normal.  Neck: Normal range of motion. Neck supple.  Pulmonary/Chest: Effort normal.  Musculoskeletal: Normal range of motion.  Neurological: She is alert and oriented to person, place, and time.  Skin: Skin is warm and dry.  Psychiatric: She has a normal mood and affect. Her behavior is normal.  Nursing note and vitals reviewed.   ED Course  Procedures (including critical care time) DIAGNOSTIC STUDIES: Oxygen Saturation is 100% on room air, normal by my interpretation.    COORDINATION OF CARE: 10:21 PM Discussed treatment plan with patient at beside, the patient agrees with the plan and has no further questions at this time.   Labs Review Labs Reviewed  URINALYSIS, ROUTINE W REFLEX MICROSCOPIC (NOT AT [facility]) - Abnormal; Notable for the following:    Color, Urine AMBER (*)    Specific Gravity, Urine 1.042 (*)    Bilirubin  Urine SMALL (*)    Ketones, ur 15 (*)    Leukocytes, UA SMALL (*)    All other components within normal limits  URINE MICROSCOPIC-ADD ON - Abnormal; Notable for the following:    Squamous Epithelial / LPF FEW (*)    Bacteria, UA FEW (*)    All other components within normal limits  PREGNANCY, URINE    Imaging Review No results found.   EKG Interpretation None      MDM   Final diagnoses:  RUQ pain  24 y.o. Female with ruq pain- pain increased after vomiting.  No  cholecystitis seen on us.  Patient stable here .   I personally performed the services described in this documentation, which was scribed in my presence. The recorded information has been reviewed and considered.   Joan Grizzleanielle Rindy Kollman, Joan Livingston 02/16/15 [phone]

## 2016-07-15 IMAGING — CT CT ABD-PELV W/ CM
2 of 4 series · 16 of 46 positions shown, 18 images · IV contrast (omnipaque)
Comparison: None.

CLINICAL DATA: Right lower quadrant pain for 2 days. Seen for the
same 1 month ago when was diagnosed with intestinal infection and
put on antibiotics. Symptoms feel the same. Painful urination.

EXAM:
CT ABDOMEN AND PELVIS WITH CONTRAST
TECHNIQUE: Multidetector CT imaging of the abdomen and pelvis was performed
using the standard protocol following bolus administration of
intravenous contrast.
CONTRAST:  100 mL Omnipaque 300

[Series 2: routine abd pel with · axial · 0.85mm/px · z∈[-488,-23]mm · 13 of 103 slices shown, 15 images]
[im 5/103  soft-tissue]
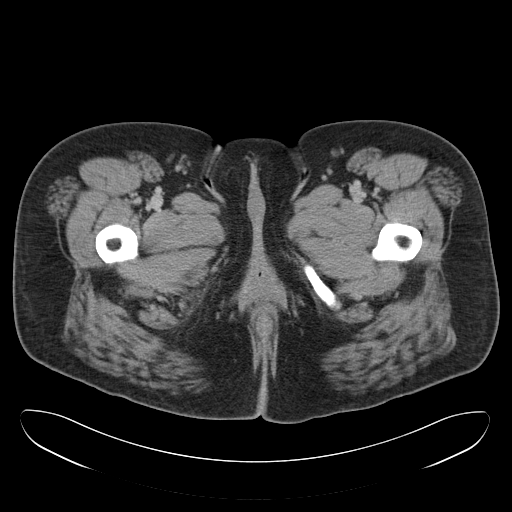
[im 5/103  bone]
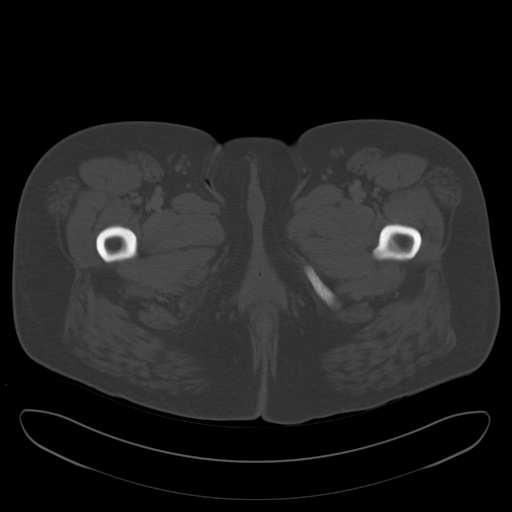
[im 14/103  soft-tissue]
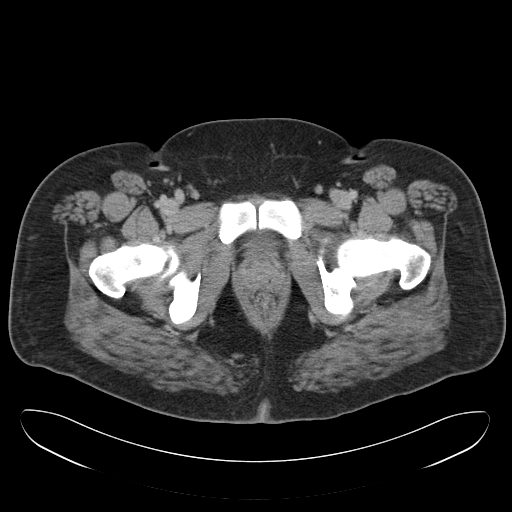
[im 23/103  soft-tissue]
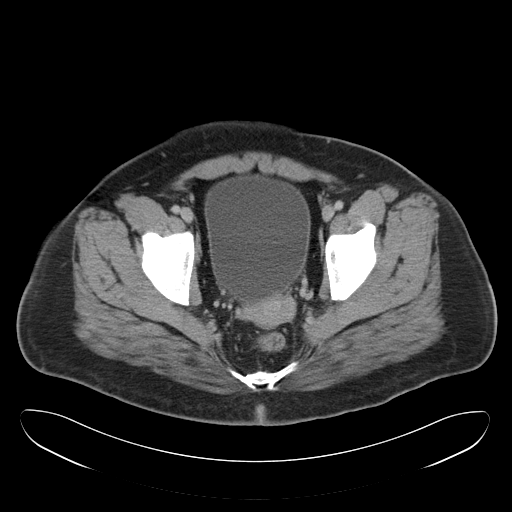
[im 27/103  soft-tissue]
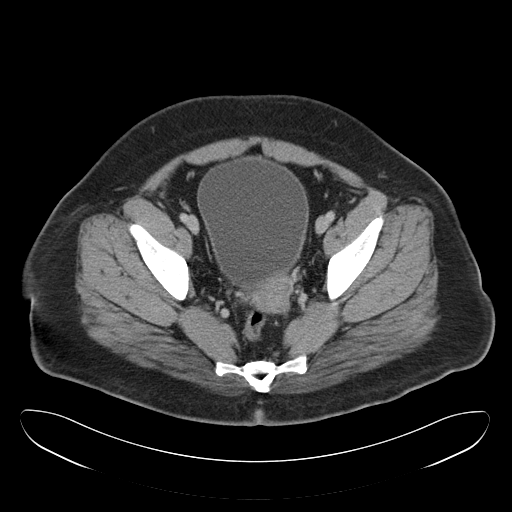
[im 36/103  soft-tissue]
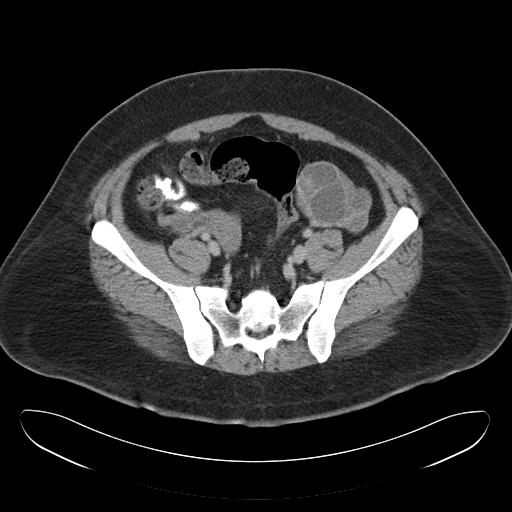
[im 45/103  soft-tissue]
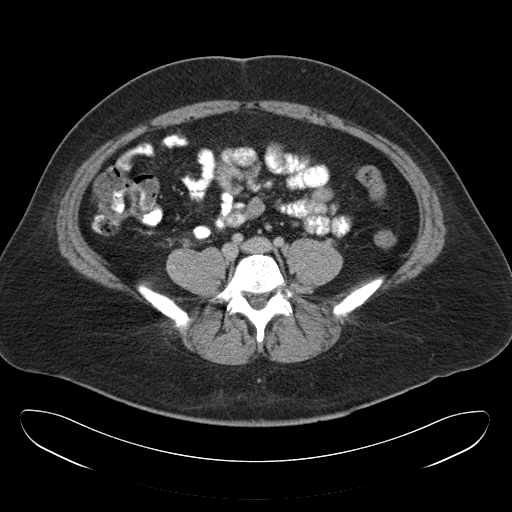
[im 54/103  soft-tissue]
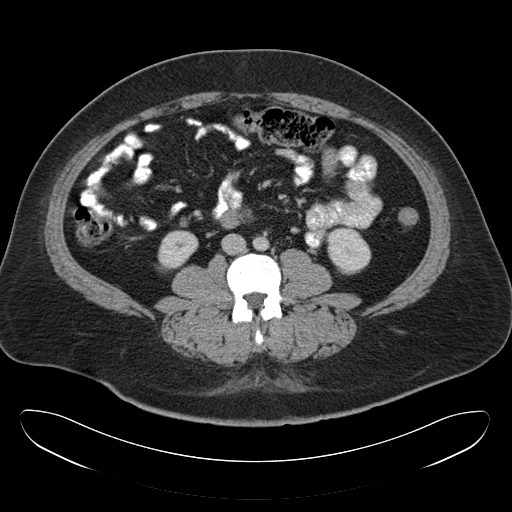
[im 58/103  soft-tissue]
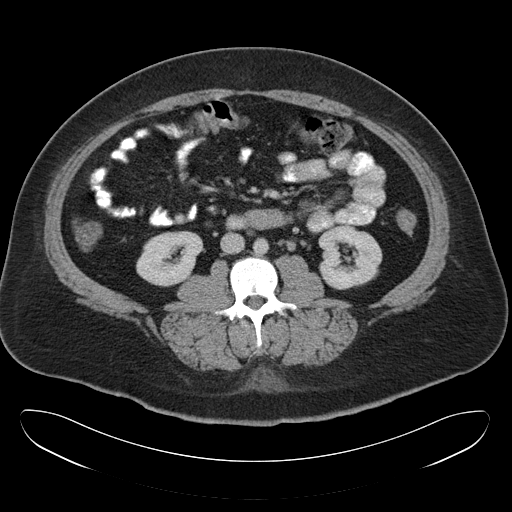
[im 67/103  soft-tissue]
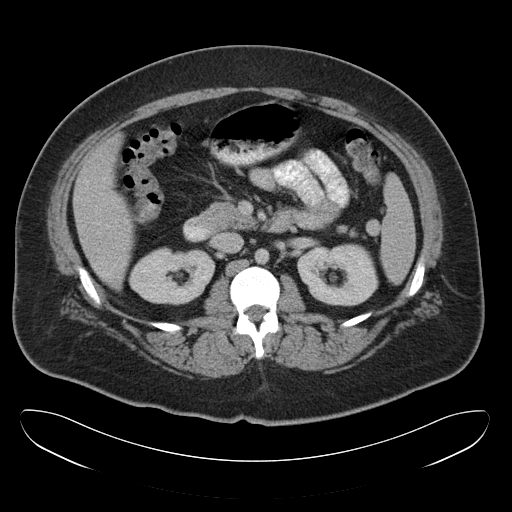
[im 67/103  bone]
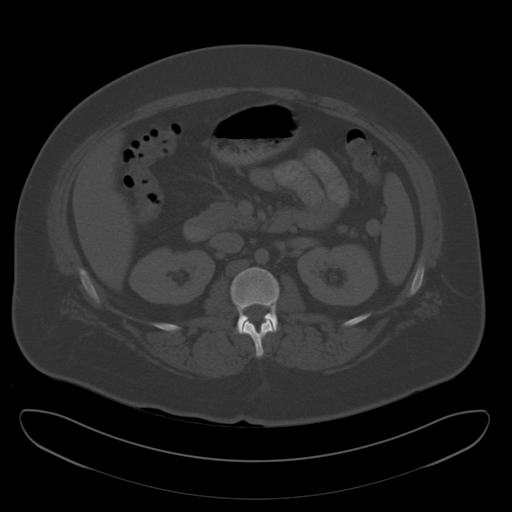
[im 76/103  soft-tissue]
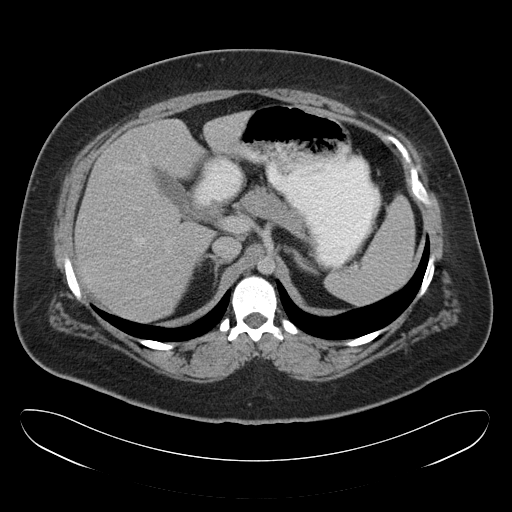
[im 80/103  soft-tissue]
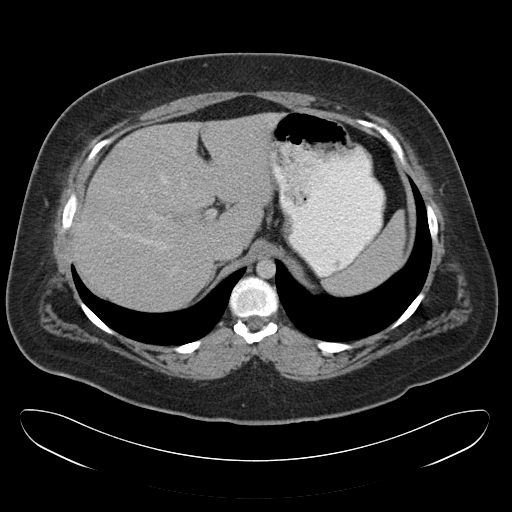
[im 89/103  soft-tissue]
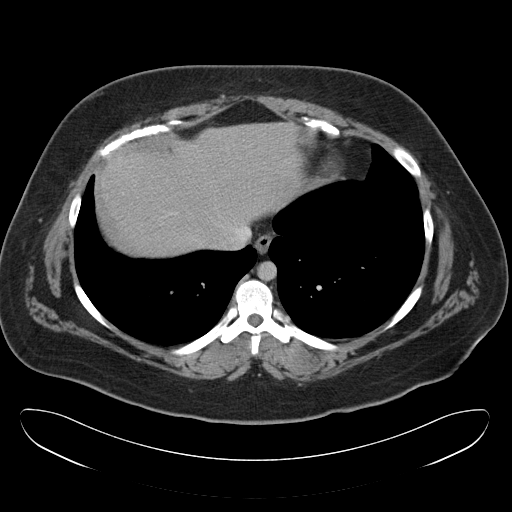
[im 98/103  soft-tissue]
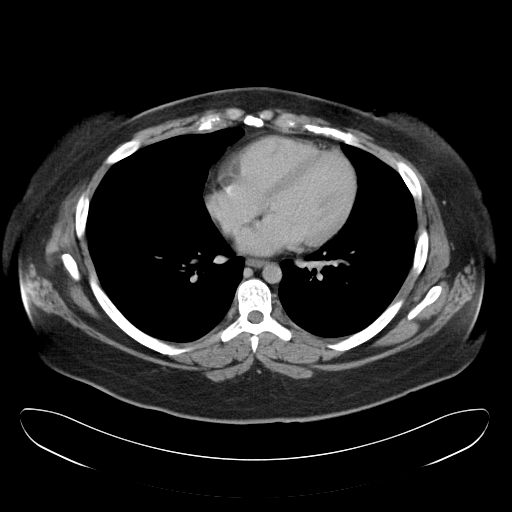

[Series 5: cor routine abd pel with · coronal · 0.81mm/px · 3 of 125 slices shown]
[im 42/125  soft-tissue]
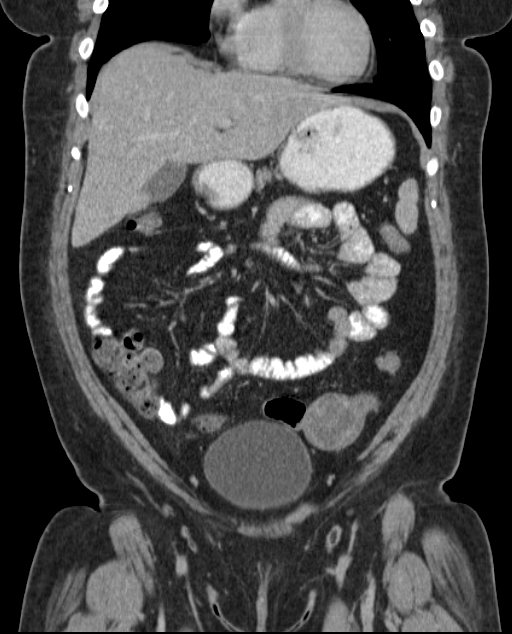
[im 56/125  soft-tissue]
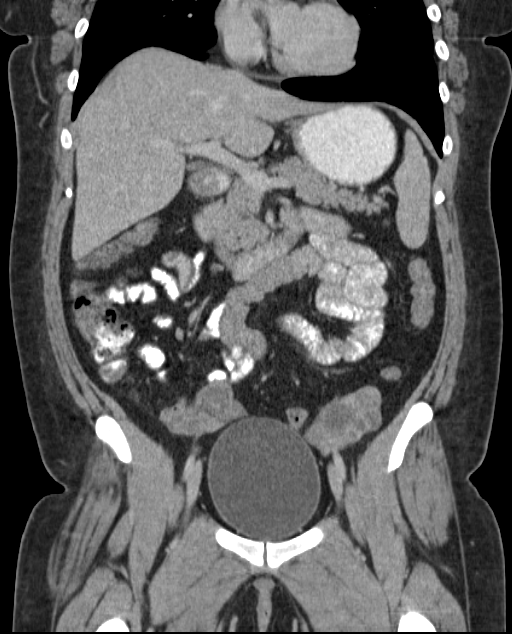
[im 69/125  soft-tissue]
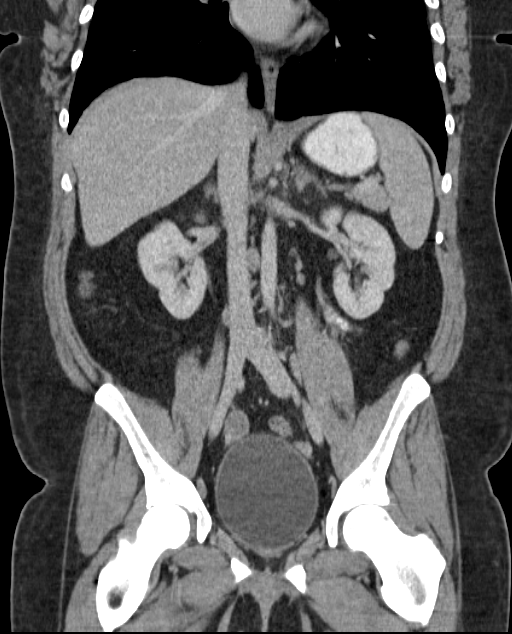

[16 of 46 positions shown; findings below may reference images not displayed]

FINDINGS: Lung bases are clear.

Liver, spleen, gallbladder, pancreas, adrenal glands, kidneys,
abdominal aorta, inferior vena cava, and retroperitoneal lymph nodes
are unremarkable. Small accessory spleens are present. Stomach,
small bowel, and colon are grossly unremarkable. Contrast material
flows to the colon without evidence of obstruction. No free air or
free fluid in the abdomen.

Pelvis: The appendix is normal. There is no appendiceal distention
or appendicolith demonstrated. However, there is slight infiltration
demonstrated in the fat around the tip of the appendix. Mild chronic
tip appendicitis is not excluded. Prominent left ovary with
multiloculated cyst or cysts, measuring up to 4.5 x 7 cm. Suggest
ultrasound for further evaluation. Uterus and right ovary are not
enlarged. No free or loculated pelvic fluid collections. No pelvic
lymphadenopathy. Bladder wall is not thickened.
IMPRESSION: Enlarged cystic appearance of the left ovary. Suggest ultrasound for
further evaluation. Normal appendix but slight infiltration around
the appendiceal tip suggest possible chronic tip appendicitis.

## 2021-04-24 ENCOUNTER — Encounter (HOSPITAL_BASED_OUTPATIENT_CLINIC_OR_DEPARTMENT_OTHER): Payer: Self-pay | Admitting: *Deleted

## 2021-04-24 ENCOUNTER — Other Ambulatory Visit: Payer: Self-pay

## 2021-04-24 ENCOUNTER — Emergency Department (HOSPITAL_BASED_OUTPATIENT_CLINIC_OR_DEPARTMENT_OTHER)
Admission: EM | Admit: 2021-04-24 | Discharge: 2021-04-24 | Disposition: A | Discharge status: Home / Self Care | Payer: No Typology Code available for payment source | Attending: Emergency Medicine | Admitting: Emergency Medicine

## 2021-04-24 DIAGNOSIS — H9201 Otalgia, right ear: Secondary | ICD-10-CM | POA: Insufficient documentation

## 2021-04-24 DIAGNOSIS — R519 Headache, unspecified: Secondary | ICD-10-CM | POA: Insufficient documentation

## 2021-04-24 DIAGNOSIS — M79672 Pain in left foot: Secondary | ICD-10-CM | POA: Insufficient documentation

## 2021-04-24 DIAGNOSIS — Z5321 Procedure and treatment not carried out due to patient leaving prior to being seen by health care provider: Secondary | ICD-10-CM | POA: Insufficient documentation

## 2021-04-24 DIAGNOSIS — Y92410 Unspecified street and highway as the place of occurrence of the external cause: Secondary | ICD-10-CM | POA: Insufficient documentation

## 2021-04-24 HISTORY — DX: Unspecified disorder of eye and adnexa: H57.9
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
